# Patient Record
Sex: Male | Born: 1995 | Hispanic: Yes | Marital: Single | Smoking: Never smoker
Health system: Southern US, Community
[De-identification: ages and names within clinical notes are randomized; demographics above are authoritative.]

## PROBLEM LIST (undated history)

## (undated) HISTORY — PX: APPENDECTOMY: SHX54

---
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
## 2018-06-18 ENCOUNTER — Emergency Department (HOSPITAL_COMMUNITY)
Admission: EM | Admit: 2018-06-18 | Discharge: 2018-06-18 | Disposition: A | Discharge status: Home / Self Care | Payer: Self-pay | Attending: Emergency Medicine | Admitting: Emergency Medicine

## 2018-06-18 ENCOUNTER — Encounter (HOSPITAL_COMMUNITY): Payer: Self-pay | Admitting: *Deleted

## 2018-06-18 DIAGNOSIS — H66001 Acute suppurative otitis media without spontaneous rupture of ear drum, right ear: Secondary | ICD-10-CM | POA: Insufficient documentation

## 2018-06-18 DIAGNOSIS — R05 Cough: Secondary | ICD-10-CM | POA: Insufficient documentation

## 2018-06-18 DIAGNOSIS — J029 Acute pharyngitis, unspecified: Secondary | ICD-10-CM | POA: Insufficient documentation

## 2018-06-18 MED ORDER — AMOXICILLIN 500 MG PO CAPS: 500.0000 mg | ORAL_CAPSULE | Freq: Two times a day (BID) | ORAL | 0 refills | Status: AC

## 2018-06-18 NOTE — Discharge Instructions (Addendum)
Please read instructions below.  You can take tylenol or ibuprofen as needed for sore throat or fever.  Drink plenty of water.  Use saline nasal spray for congestion. Take the antibiotic as prescribed until completely gone. Follow up with your primary care provider as needed.  Return to the ER for severely worsening ear pain, sudden hearing loss, inability to swallow liquids, difficulty breathing, or new or worsening symptoms.

## 2018-06-18 NOTE — ED Provider Notes (Signed)
[facility]   CSN: [id number] Arrival date & time: 06/18/18  1416     History   Chief Complaint Chief Complaint  Patient presents with  . Otalgia    HPI Kelly Santiago is a 22 y.o. male without significant PMHx, presenting to the ED with 4 days of ear pain. Pt states his family has similar cold sx. His sx consist of dry cough and improving sore throat. He also began having left ear pain 4 days ago which has resolved, however his right ear began hurting today. No assoc fever. Has taken nyquil with relief of cold sx.  The history is provided by the patient.    History reviewed. No pertinent past medical history.  There are no active problems to display for this patient.   Past Surgical History:  Procedure Laterality Date  . APPENDECTOMY          Home Medications    Prior to Admission medications   Medication Sig Start Date End Date Taking? Authorizing Provider  amoxicillin (AMOXIL) 500 MG capsule Take 1 capsule (500 mg total) by mouth 2 (two) times daily for 7 days. 06/18/18 06/25/18  , [place] N, PA-C    Family History History reviewed. No pertinent family history.  Social History Social History   Tobacco Use  . Smoking status: Never Smoker  Substance Use Topics  . Alcohol use: No  . Drug use: No     Allergies   Patient has no known allergies.   Review of Systems Review of Systems  Constitutional: Negative for fever.  HENT: Positive for ear pain and sore throat. Negative for congestion, ear discharge and trouble swallowing.   Respiratory: Positive for cough. Negative for shortness of breath.      Physical Exam Updated Vital Signs BP (!) 115/47 (BP Location: Right Arm)   Pulse (!) 56   Temp 98.4 F (36.9 C) (Oral)   Resp (!) 22   SpO2 99%   Physical Exam Vitals signs and nursing note reviewed.  Constitutional:      Appearance: He is well-developed.  HENT:     Head:  Normocephalic and atraumatic.     Right Ear: Hearing and ear canal normal. A middle ear effusion (purulent) is present. No mastoid tenderness. Tympanic membrane is bulging.     Left Ear: Hearing, tympanic membrane, ear canal and external ear normal.     Mouth/Throat:     Pharynx: Posterior oropharyngeal erythema present. No oropharyngeal exudate.     Comments: Tolerating secretions, no trismus or uvula deviation. Eyes:     Conjunctiva/sclera: Conjunctivae normal.  Neck:     Musculoskeletal: Normal range of motion.  Cardiovascular:     Rate and Rhythm: Normal rate and regular rhythm.  Pulmonary:     Effort: Pulmonary effort is normal.     Breath sounds: Normal breath sounds.  Lymphadenopathy:     Cervical: No cervical adenopathy.  Neurological:     Mental Status: He is alert.  Psychiatric:        Mood and Affect: Mood normal.        Behavior: Behavior normal.      ED Treatments / Results  Labs (all labs ordered are listed, but only abnormal results are displayed) Labs Reviewed - No data to display  EKG None  Radiology No results found.  Procedures Procedures (including critical care time)  Medications Ordered in ED Medications - No data to display   Initial Impression / Assessment  and Plan / ED Course  I have reviewed the triage vital signs and the nursing notes.  Pertinent labs & imaging results that were available during my care of the patient were reviewed by me and considered in my medical decision making (see chart for details).    Patient presents with otalgia and exam consistent with acute otitis media. No concern for acute mastoiditis, meningitis. No antibiotic use in the last month.  Patient discharged home with Amoxicillin. Advised PCP follow up.  I have also discussed reasons to return immediately to the ER.  Pt expresses understanding and agrees with plan.  Discussed results, findings, treatment and follow up. Patient advised of return precautions.  Patient verbalized understanding and agreed with plan.  Final Clinical Impressions(s) / ED Diagnoses   Final diagnoses:  Non-recurrent acute suppurative otitis media of right ear without spontaneous rupture of tympanic membrane    ED Discharge Orders         Ordered    amoxicillin (AMOXIL) 500 MG capsule  2 times daily     06/18/18 1602           , [place] N, PA-C 06/18/18 1604    Gerhard MunchLockwood, Robert, MD 06/20/18 1526

## 2018-06-18 NOTE — ED Notes (Signed)
Pt stable and ambulatory for discharge, states understanding follow up.  

## 2018-06-18 NOTE — ED Triage Notes (Signed)
Pt in c/o earache for the last few days, no distress noted

## 2020-10-23 ENCOUNTER — Emergency Department (HOSPITAL_COMMUNITY): Payer: Self-pay

## 2020-10-23 ENCOUNTER — Encounter (HOSPITAL_COMMUNITY): Payer: Self-pay

## 2020-10-23 ENCOUNTER — Other Ambulatory Visit: Payer: Self-pay

## 2020-10-23 ENCOUNTER — Emergency Department (HOSPITAL_COMMUNITY)
Admission: EM | Admit: 2020-10-23 | Discharge: 2020-10-23 | Disposition: A | Discharge status: Home / Self Care | Payer: Self-pay | Attending: Emergency Medicine | Admitting: Emergency Medicine

## 2020-10-23 DIAGNOSIS — R1032 Left lower quadrant pain: Secondary | ICD-10-CM | POA: Insufficient documentation

## 2020-10-23 DIAGNOSIS — S161XXA Strain of muscle, fascia and tendon at neck level, initial encounter: Secondary | ICD-10-CM

## 2020-10-23 DIAGNOSIS — R059 Cough, unspecified: Secondary | ICD-10-CM | POA: Insufficient documentation

## 2020-10-23 DIAGNOSIS — R1012 Left upper quadrant pain: Secondary | ICD-10-CM | POA: Insufficient documentation

## 2020-10-23 DIAGNOSIS — R198 Other specified symptoms and signs involving the digestive system and abdomen: Secondary | ICD-10-CM

## 2020-10-23 DIAGNOSIS — M62838 Other muscle spasm: Secondary | ICD-10-CM

## 2020-10-23 DIAGNOSIS — M62830 Muscle spasm of back: Secondary | ICD-10-CM | POA: Insufficient documentation

## 2020-10-23 LAB — CBC WITH DIFFERENTIAL/PLATELET
Abs Immature Granulocytes: 0.02 10*3/uL (ref 0.00–0.07)
Basophils Absolute: 0 10*3/uL (ref 0.0–0.1)
Basophils Relative: 1 %
Eosinophils Absolute: 0.3 10*3/uL (ref 0.0–0.5)
Eosinophils Relative: 4 %
HCT: 46 % (ref 39.0–52.0)
Hemoglobin: 15.1 g/dL (ref 13.0–17.0)
Immature Granulocytes: 0 %
Lymphocytes Relative: 43 %
Lymphs Abs: 3.5 10*3/uL (ref 0.7–4.0)
MCH: 30 pg (ref 26.0–34.0)
MCHC: 32.8 g/dL (ref 30.0–36.0)
MCV: 91.5 fL (ref 80.0–100.0)
Monocytes Absolute: 0.6 10*3/uL (ref 0.1–1.0)
Monocytes Relative: 7 %
Neutro Abs: 3.7 10*3/uL (ref 1.7–7.7)
Neutrophils Relative %: 45 %
Platelets: 248 10*3/uL (ref 150–400)
RBC: 5.03 MIL/uL (ref 4.22–5.81)
RDW: 13.6 % (ref 11.5–15.5)
WBC: 8.2 10*3/uL (ref 4.0–10.5)
nRBC: 0 % (ref 0.0–0.2)

## 2020-10-23 LAB — COMPREHENSIVE METABOLIC PANEL
ALT: 16 U/L (ref 0–44)
AST: 17 U/L (ref 15–41)
Albumin: 4.3 g/dL (ref 3.5–5.0)
Alkaline Phosphatase: 71 U/L (ref 38–126)
Anion gap: 10 (ref 5–15)
BUN: 9 mg/dL (ref 6–20)
CO2: 25 mmol/L (ref 22–32)
Calcium: 9.3 mg/dL (ref 8.9–10.3)
Chloride: 104 mmol/L (ref 98–111)
Creatinine, Ser: 0.8 mg/dL (ref 0.61–1.24)
GFR, Estimated: >60 mL/min (ref >60)
Glucose, Bld: 104 mg/dL — ABNORMAL HIGH (ref 70–99)
Potassium: 3.6 mmol/L (ref 3.5–5.1)
Sodium: 139 mmol/L (ref 135–145)
Total Bilirubin: 0.5 mg/dL (ref 0.3–1.2)
Total Protein: 7.7 g/dL (ref 6.5–8.1)

## 2020-10-23 LAB — URINALYSIS, ROUTINE W REFLEX MICROSCOPIC
Bilirubin Urine: NEGATIVE
Glucose, UA: NEGATIVE mg/dL
Hgb urine dipstick: NEGATIVE
Ketones, ur: NEGATIVE mg/dL
Leukocytes,Ua: NEGATIVE
Nitrite: NEGATIVE
Protein, ur: NEGATIVE mg/dL
Specific Gravity, Urine: 1.023 (ref 1.005–1.030)
pH: 6 (ref 5.0–8.0)

## 2020-10-23 LAB — LIPASE, BLOOD: Lipase: 27 U/L (ref 11–51)

## 2020-10-23 MED ORDER — METHOCARBAMOL 500 MG PO TABS: 500.0000 mg | ORAL_TABLET | Freq: Two times a day (BID) | ORAL | 0 refills | Status: AC | PRN

## 2020-10-23 MED ORDER — IOHEXOL 300 MG/ML  SOLN
100.0000 mL | Freq: Once | INTRAMUSCULAR | Status: AC | PRN
  Administered 2020-10-23: 100 mL via INTRAVENOUS

## 2020-10-23 NOTE — Discharge Instructions (Signed)
You were seen in the emergency department today for your abdominal pain and your neck/back pain.  Your physical exam and vital signs are very reassuring, as was your blood work and CT scan.  The muscles in your neck and upper are in what is called spasm, meaning they are inappropriately tightened up.  This can be quite painful.  To help with your pain you may take Tylenol and / or NSAID medication (such as ibuprofen or naproxen) to help with your pain.  Additionally, you have been prescribed a muscle relaxer called Robaxin to help relieve some of the muscle spasm.  Please be advised that this medication may make you very sleepy, so you should not drive or operate heavy machinery while you are taking it.  You may also utilize topical pain relief such as Biofreeze, IcyHot, or topical lidocaine patches.  I also recommend that you apply heat to the area, such as a hot shower or heating pad, and follow heat application with massage of the muscles that are most tight.  I also recommend you adjust your seating arrangement and your job to be more supportive of your posture.  In regards to your abdominal pain, there is no concerning findings on your CT scan the emergency department today.  Suspect you have mild constipation causing you to have sensation that you need to have multiple bowel movements every day to empty your colon.  You may begin taking daily MiraLAX which you may pick up over-the-counter.  Please return to the emergency department if you develop any numbness/tingling/weakness in your arms or legs, any difficulty urinating, or urinary incontinence chest pain, shortness of breath, abdominal pain, nausea or vomiting that does not stop, or any other new severe symptoms.

## 2020-10-23 NOTE — ED Triage Notes (Signed)
Pt reports mid thoracic back pain, intermittent for the past few months as well as left sided flank pain that radiates to his groin. Denies urinary s/s or n/v. Pt ambulatory.

## 2020-10-23 NOTE — ED Provider Notes (Signed)
[facility] EMERGENCY DEPARTMENT Provider Note   CSN: [id number] Arrival date & time: 10/23/20  2979     History Chief Complaint  Patient presents with  . Back Pain  . Flank Pain   History provided by the patient without the assistance of an interpreter.  Patient speaks fluent [place].  Kelly Santiago is a 25 y.o. male who presents with concern for midthoracic back pain intermittently over the last few months, worsened with movement, particularly while playing soccer.  Additionally has had 2 months of intermittent progressively worsening left-sided abdominal pain that starts in his flank and upper abdomen and radiates down into his groin.  Denies any urinary frequency, urgency, dysuria,difficulty passing urine, or hematuria.  Difficulty denies any fevers or chills.  He does state that he had a mild cough 2 days ago that has resolved at this time.  Denies any sore throat, congestion.  Denies nausea, vomiting, diarrhea but does state that he often experiences tenesmus without ability to pass stool.  Abdominal pain is relieved with bowel movement.   Patient with history of appendectomy.  He does not carry medical diagnoses and is not on any medications every day.   HPI     History reviewed. No pertinent past medical history.  There are no problems to display for this patient.   Past Surgical History:  Procedure Laterality Date  . APPENDECTOMY         No family history on file.  Social History   Tobacco Use  . Smoking status: Never Smoker  Substance Use Topics  . Alcohol use: No  . Drug use: No    Home Medications Prior to Admission medications   Not on File    Allergies    Patient has no known allergies.  Review of Systems   Review of Systems  Constitutional: Negative for activity change, appetite change, chills, diaphoresis, fatigue, fever and unexpected weight change.  HENT: Negative.   Respiratory: Negative.   Cardiovascular:  Negative.   Gastrointestinal: Positive for abdominal pain. Negative for blood in stool, constipation, diarrhea, nausea, rectal pain and vomiting.       Tenesmus  Genitourinary: Negative.   Musculoskeletal: Positive for back pain.  Skin: Negative.   Allergic/Immunologic: Negative.   Neurological: Negative.   Hematological: Negative.     Physical Exam Updated Vital Signs BP 114/66 (BP Location: Right Arm)   Pulse (!) 57   Temp 98.3 F (36.8 C) (Oral)   Resp 17   Ht 5\' 1"  (1.549 m)   Wt 63.5 kg   SpO2 99%   BMI 26.45 kg/m   Physical Exam Vitals and nursing note reviewed.  Constitutional:      Appearance: He is not ill-appearing or toxic-appearing.  HENT:     Head: Normocephalic and atraumatic.     Nose: Nose normal.     Mouth/Throat:     Mouth: Mucous membranes are moist.     Pharynx: Oropharynx is clear. Uvula midline. No pharyngeal swelling, oropharyngeal exudate, posterior oropharyngeal erythema or uvula swelling.     Tonsils: No tonsillar exudate. 2+ on the right. 2+ on the left.  Eyes:     General: Lids are normal. Vision grossly intact.        Right eye: No discharge.        Left eye: No discharge.     Conjunctiva/sclera: Conjunctivae normal.     Pupils: Pupils are equal, round, and reactive to light.  Neck:     Trachea: Trachea  and phonation normal.  Cardiovascular:     Rate and Rhythm: Normal rate and regular rhythm.     Pulses: Normal pulses.     Heart sounds: Normal heart sounds. No murmur heard.   Pulmonary:     Effort: Pulmonary effort is normal. No tachypnea, bradypnea, accessory muscle usage, prolonged expiration or respiratory distress.     Breath sounds: Normal breath sounds. No wheezing or rales.  Chest:     Chest wall: No mass, lacerations, deformity, swelling, tenderness, crepitus or edema.  Abdominal:     General: Bowel sounds are normal. There is no distension.     Palpations: Abdomen is soft.     Tenderness: There is abdominal tenderness in  the left upper quadrant and left lower quadrant. There is no right CVA tenderness, left CVA tenderness, guarding or rebound.  Musculoskeletal:        General: No deformity.     Cervical back: Normal range of motion and neck supple. Spasms present. No edema, rigidity, tenderness, bony tenderness or crepitus. No pain with movement, spinous process tenderness or muscular tenderness.     Thoracic back: Spasms and tenderness present. No bony tenderness.     Lumbar back: Spasms present. No tenderness or bony tenderness. Negative right straight leg raise test and negative left straight leg raise test.     Right lower leg: No edema.     Left lower leg: No edema.  Lymphadenopathy:     Cervical: No cervical adenopathy.  Skin:    General: Skin is warm and dry.     Capillary Refill: Capillary refill takes less than 2 seconds.  Neurological:     General: No focal deficit present.     Mental Status: He is alert and oriented to person, place, and time. Mental status is at baseline.     Sensory: Sensation is intact.     Motor: Motor function is intact.  Psychiatric:        Mood and Affect: Mood normal.     ED Results / Procedures / Treatments   Labs (all labs ordered are listed, but only abnormal results are displayed) Labs Reviewed  COMPREHENSIVE METABOLIC PANEL - Abnormal; Notable for the following components:      Result Value   Glucose, Bld 104 (*)    All other components within normal limits  LIPASE, BLOOD  CBC WITH DIFFERENTIAL/PLATELET  URINALYSIS, ROUTINE W REFLEX MICROSCOPIC    EKG None  Radiology DG Chest 2 View  Result Date: 10/23/2020 CLINICAL DATA:  Chest pain. EXAM: CHEST - 2 VIEW COMPARISON:  Nov 26, 2008. FINDINGS: The heart size and mediastinal contours are within normal limits. Both lungs are clear. The visualized skeletal structures are unremarkable. IMPRESSION: No active cardiopulmonary disease. Electronically Signed   By: Lupita RaiderJames  Green Jr M.D.   On: 10/23/2020 10:21    CT Abdomen Pelvis W Contrast  Result Date: 10/23/2020 CLINICAL DATA:  Left upper and left lower quadrant abdominal pain for 3 months. EXAM: CT ABDOMEN AND PELVIS WITH CONTRAST TECHNIQUE: Multidetector CT imaging of the abdomen and pelvis was performed using the standard protocol following bolus administration of intravenous contrast. CONTRAST:  100mL OMNIPAQUE IOHEXOL 300 MG/ML  SOLN COMPARISON:  None. FINDINGS: Lower chest: No significant pulmonary nodules or acute consolidative airspace disease. Hepatobiliary: Normal liver size. No liver mass. Normal gallbladder with no radiopaque cholelithiasis. No biliary ductal dilatation. Pancreas: Normal, with no mass or duct dilation. Spleen: Normal size. No mass. Adrenals/Urinary Tract: Normal adrenals. No contour deforming  renal masses. Normal size and location of the kidneys bilaterally. No hydronephrosis. Normal bladder. Stomach/Bowel: Normal non-distended stomach. Normal caliber small bowel with no small bowel wall thickening. Appendectomy. Normal large bowel with no diverticulosis, large bowel wall thickening or pericolonic fat stranding. Vascular/Lymphatic: Normal caliber abdominal aorta. Patent portal, splenic, hepatic and renal veins. No pathologically enlarged lymph nodes in the abdomen or pelvis. Reproductive: Normal size prostate. Other: No pneumoperitoneum, ascites or focal fluid collection. Musculoskeletal: No aggressive appearing focal osseous lesions. IMPRESSION: No acute abnormality. No evidence of bowel obstruction or acute bowel inflammation. Electronically Signed   By: Delbert Phenix M.D.   On: 10/23/2020 12:52    Procedures Procedures   Medications Ordered in ED Medications  iohexol (OMNIPAQUE) 300 MG/ML solution 100 mL (100 mLs Intravenous Contrast Given 10/23/20 1149)    ED Course  I have reviewed the triage vital signs and the nursing notes.  Pertinent labs & imaging results that were available during my care of the patient were  reviewed by me and considered in my medical decision making (see chart for details).  Clinical Course as of 10/23/20 1314  Tue Oct 23, 2020  1309 Leukocytes,Ua: NEGATIVE [RS]    Clinical Course User Index [RS] Shenia Alan, Idelia Salm   MDM Rules/Calculators/A&P                          50 old male presents with concern for intermittent muscular pain in the mid and upper back for the last few months as well as intermittent left-sided abdominal pain without nausea, vomiting, or diarrhea.  No urinary symptoms.  Differential diagnosis for this patient's  Listed abdominal pain includes but is not limited to constipation, diverticulitis, splenic infarct or ischemia, mesenteric ischemia, appendicitis, gastritis, duodenitis, GERD, musculoskeletal injury, pneumonia, pleural effusion.   Vital signs are normal on intake.  Cardiopulmonary exam is normal, abdominal exam revealed mild left-sided tenderness to palpation in the upper and lower quadrants.  Musculoskeletal exam revealed spasm and tenderness palpation of the thoracic and cervical paraspinous musculature.  Full range of motion of the cervical spine.  CBC unremarkable, CMP unremarkable, lipase is normal, urine is unremarkable,  chest x-ray negative for acute cardiopulmonary disease.  CT scan negative for acute intra-abdominal or pelvic abnormality.  Suspect muscle spasm as source for patient's intermittent neck and upper back pain.  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  Will prescribe course of Robaxin at home and education regarding improved posture and stretching.  In regards to patient's abdominal symptoms, there is no appear to be any emergent issue in his abdomen at this time. Suspect mild constipation causing sensation of tenesmus and multiple small bowel movements daily.  Recommend MiraLAX.  Will provide contact information for [facility] where he may establish care and  follow-up.  Ayiden voiced understanding of his medical evaluation and treatment plan.  Each of his questions was answered to his expressed satisfaction.  Return precautions given.  Patient is well-appearing, stable, and appropriate for discharge at this time.  This chart was dictated using voice recognition software, Dragon. Despite the best efforts of this provider to proofread and correct errors, errors may still occur which can change documentation meaning.  Final Clinical Impression(s) / ED Diagnoses Final diagnoses:  None    Rx / DC Orders ED Discharge Orders    None       Paloma Grange R, PA-C  10/23/20 1316    Pricilla Loveless, MD 10/24/20 [phone]

## 2020-10-23 NOTE — ED Notes (Signed)
Patient transported to x-ray. ?

## 2020-10-23 NOTE — ED Notes (Signed)
Patient transported to CT 

## 2021-09-14 IMAGING — CR DG CHEST 2V
2 series · 2 of 2 positions shown · non-contrast
Comparison: November 26, 2008.

CLINICAL DATA: Chest pain.

EXAM:
CHEST - 2 VIEW

[chest pa]
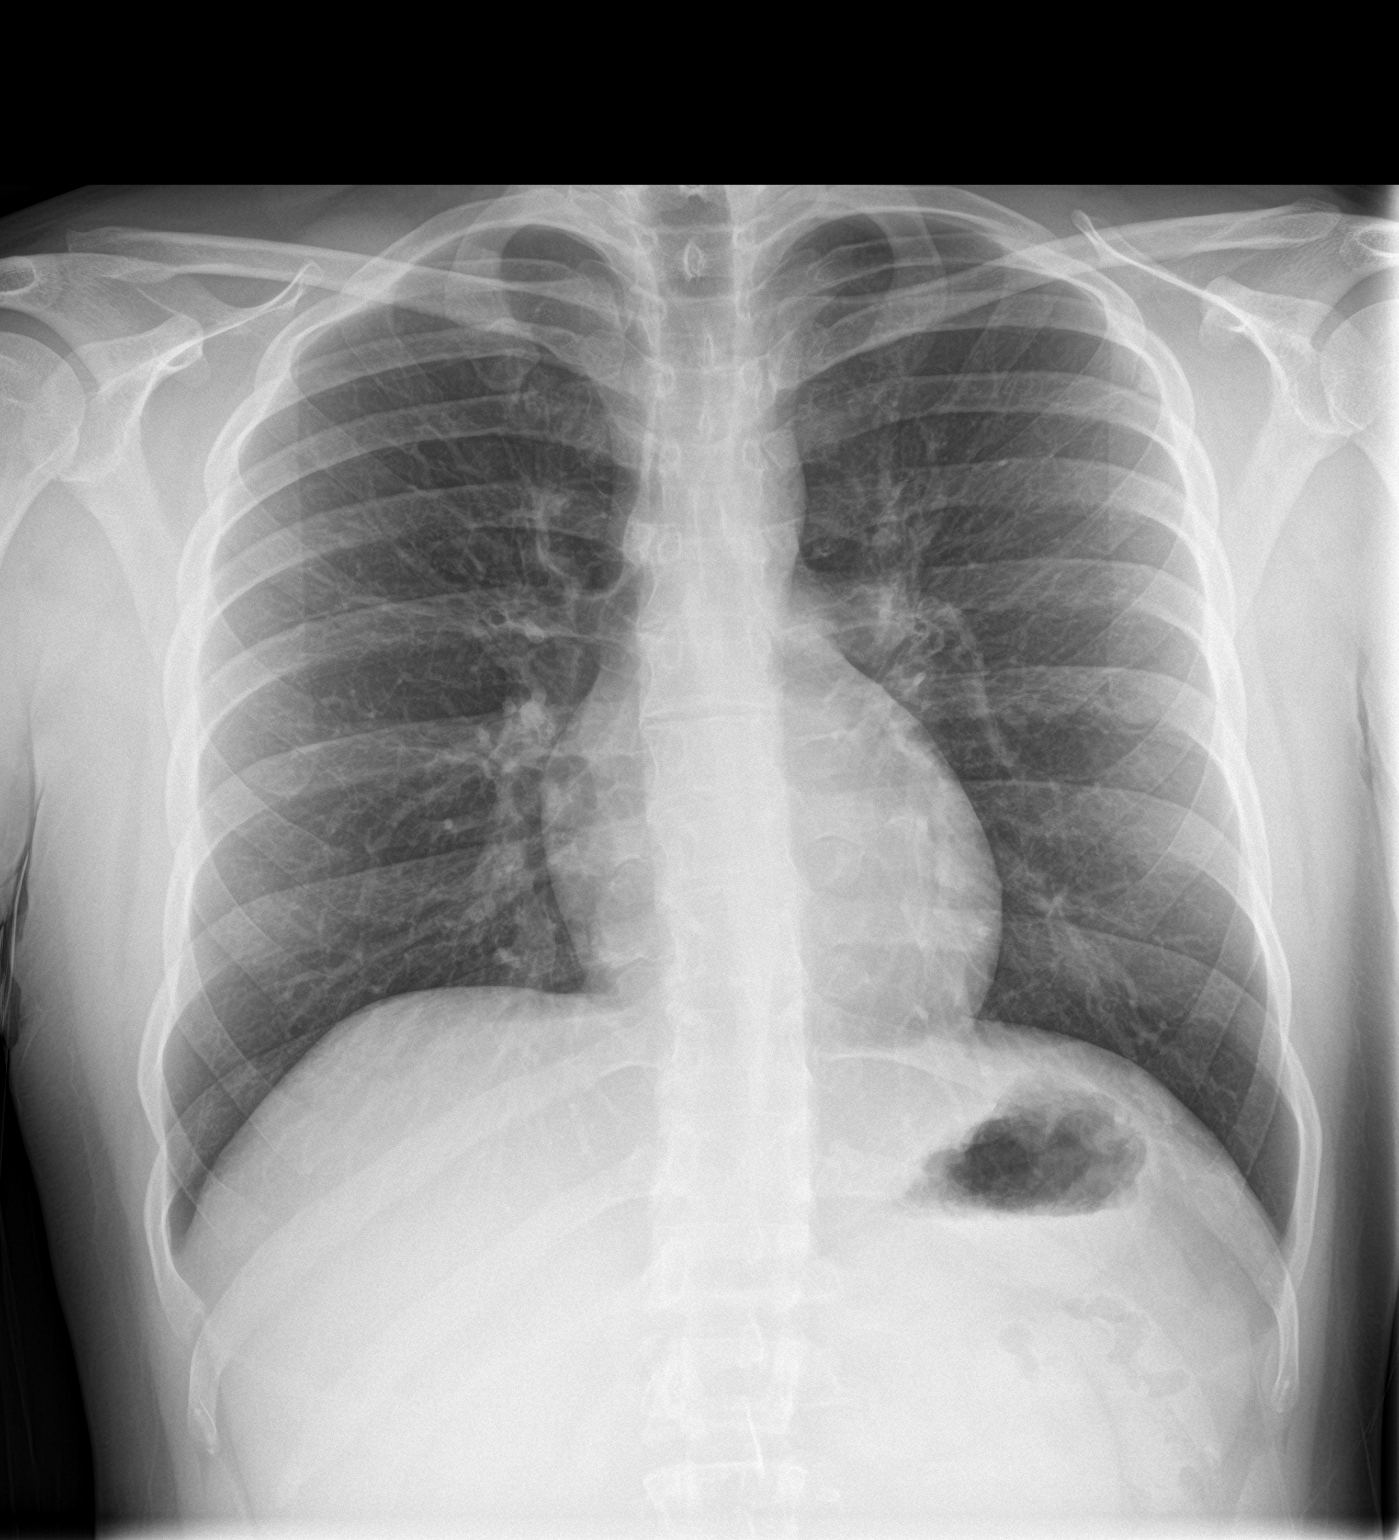

[chest lat]
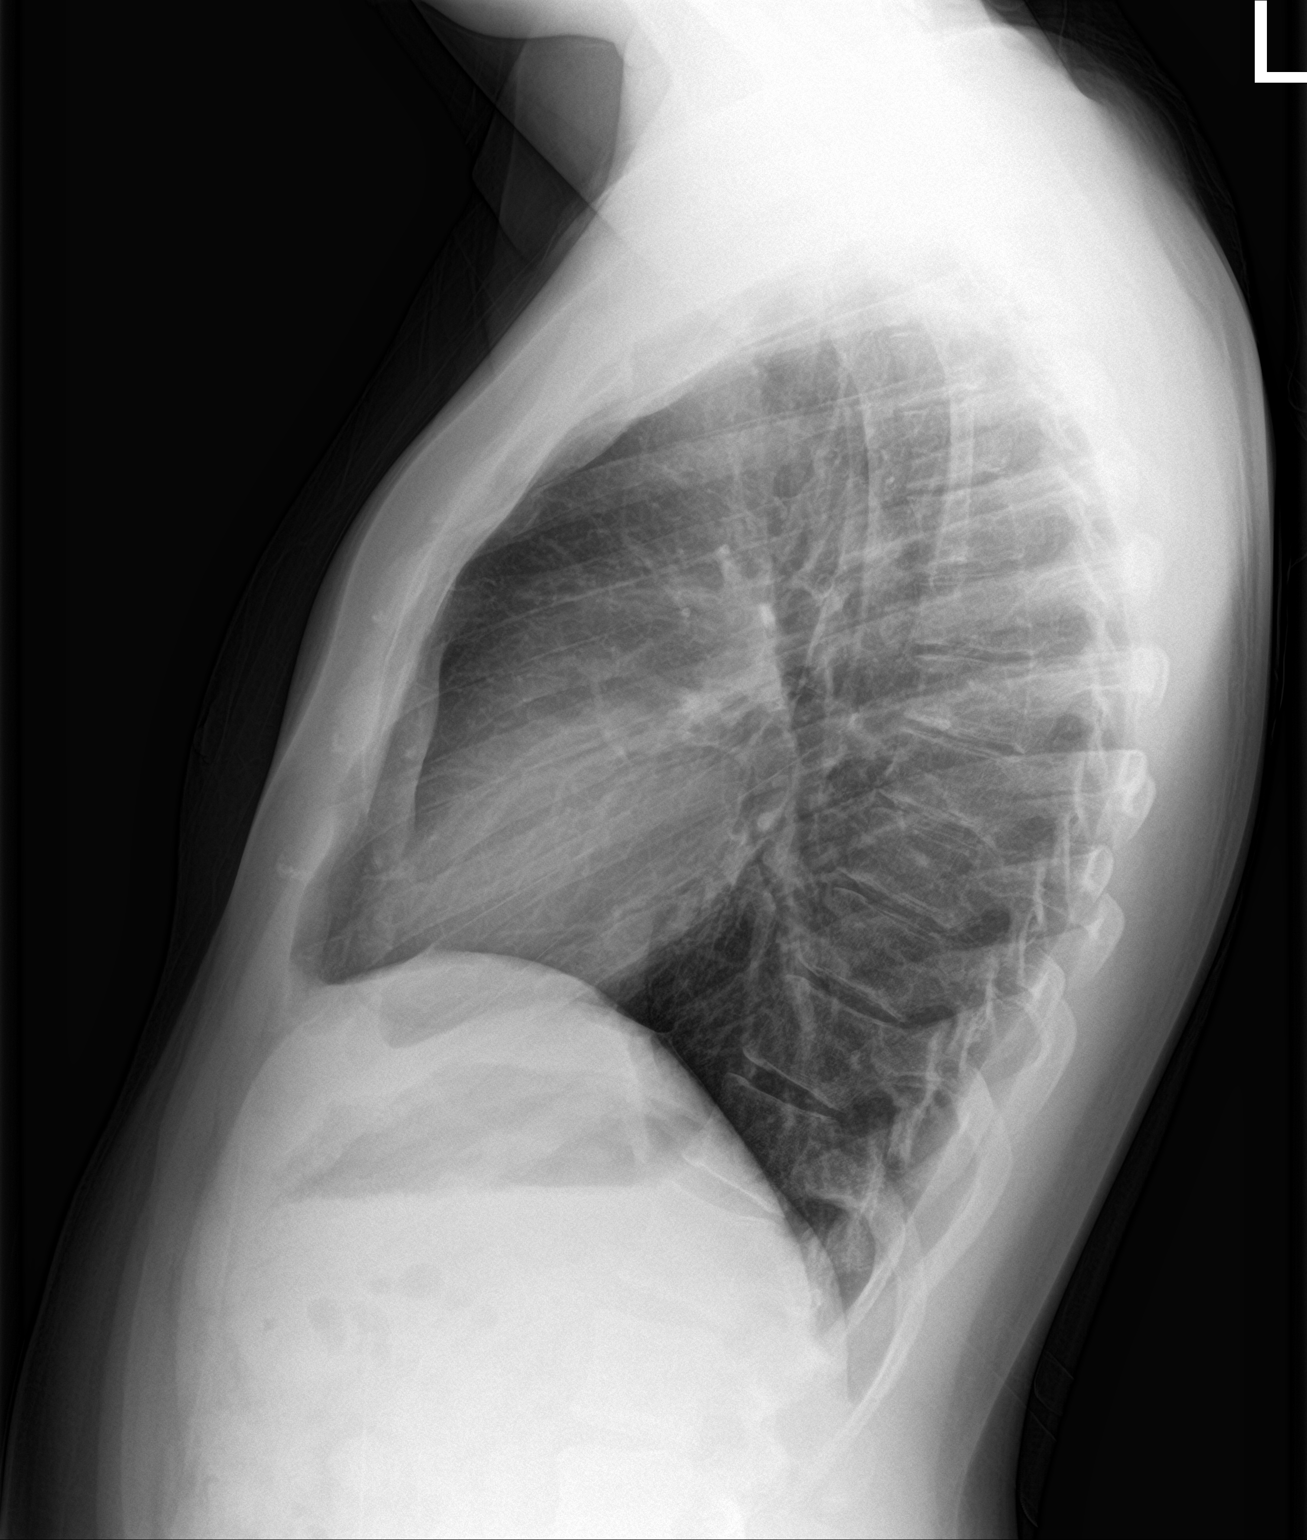

[2 of 2 positions shown; findings below may reference images not displayed]

FINDINGS: The heart size and mediastinal contours are within normal limits.
Both lungs are clear. The visualized skeletal structures are
unremarkable.
IMPRESSION: No active cardiopulmonary disease.

## 2021-12-12 ENCOUNTER — Other Ambulatory Visit: Payer: Self-pay

## 2021-12-12 ENCOUNTER — Encounter (HOSPITAL_COMMUNITY): Payer: Self-pay

## 2021-12-12 ENCOUNTER — Emergency Department (HOSPITAL_COMMUNITY)
Admission: EM | Admit: 2021-12-12 | Discharge: 2021-12-12 | Disposition: A | Discharge status: Home / Self Care | Payer: Medicaid Other | Attending: Emergency Medicine | Admitting: Emergency Medicine

## 2021-12-12 DIAGNOSIS — H66001 Acute suppurative otitis media without spontaneous rupture of ear drum, right ear: Secondary | ICD-10-CM | POA: Insufficient documentation

## 2021-12-12 DIAGNOSIS — R059 Cough, unspecified: Secondary | ICD-10-CM | POA: Insufficient documentation

## 2021-12-12 DIAGNOSIS — R0981 Nasal congestion: Secondary | ICD-10-CM | POA: Insufficient documentation

## 2021-12-12 MED ORDER — AMOXICILLIN 500 MG PO CAPS: 500.0000 mg | ORAL_CAPSULE | Freq: Three times a day (TID) | ORAL | 0 refills | Status: AC

## 2021-12-12 MED ORDER — AMOXICILLIN 500 MG PO CAPS: 500.0000 mg | ORAL_CAPSULE | Freq: Three times a day (TID) | ORAL | 0 refills | Status: DC

## 2021-12-12 NOTE — ED Provider Notes (Signed)
[facility] EMERGENCY DEPARTMENT Provider Note   CSN: [id number] Arrival date & time: 12/12/21  1207     History  Chief Complaint  Patient presents with   Otalgia    Kelly Santiago is a 26 y.o. male.  Patient reports he had a cough and congestion for the past week.  Patient reports he now has pain in his right ear he thinks he has an ear infection  The history is provided by the patient. No language interpreter was used.  Otalgia Location:  Right Behind ear:  No abnormality Quality:  Aching Severity:  Moderate Onset quality:  Gradual Duration:  1 week Timing:  Constant Chronicity:  New Relieved by:  Nothing      Home Medications Prior to Admission medications   Medication Sig Start Date End Date Taking? Authorizing Provider  amoxicillin (AMOXIL) 500 MG capsule Take 1 capsule (500 mg total) by mouth 3 (three) times daily. 12/12/21  Yes Cheron Schaumann K, PA-C  methocarbamol (ROBAXIN) 500 MG tablet Take 1 tablet (500 mg total) by mouth 2 (two) times daily as needed for muscle spasms. 10/23/20   Sponseller, Eugene Gavia, PA-C      Allergies    Patient has no known allergies.    Review of Systems   Review of Systems  HENT:  Positive for ear pain.   All other systems reviewed and are negative.   Physical Exam Updated Vital Signs BP 127/68 (BP Location: Right Arm)   Pulse (!) 57   Temp 97.8 F (36.6 C) (Oral)   Resp 14   SpO2 98%  Physical Exam Vitals and nursing note reviewed.  Constitutional:      General: He is not in acute distress.    Appearance: He is well-developed.  HENT:     Head: Normocephalic and atraumatic.     Ears:     Comments: Right TM is erythematous and bulging Eyes:     Conjunctiva/sclera: Conjunctivae normal.  Cardiovascular:     Rate and Rhythm: Normal rate and regular rhythm.     Heart sounds: No murmur heard. Pulmonary:     Effort: Pulmonary effort is normal. No respiratory distress.     Breath sounds: Normal  breath sounds.  Musculoskeletal:        General: No swelling.  Skin:    General: Skin is warm and dry.     Capillary Refill: Capillary refill takes less than 2 seconds.  Neurological:     Mental Status: He is alert.  Psychiatric:        Mood and Affect: Mood normal.     ED Results / Procedures / Treatments   Labs (all labs ordered are listed, but only abnormal results are displayed) Labs Reviewed - No data to display  EKG None  Radiology No results found.  Procedures Procedures    Medications Ordered in ED Medications - No data to display  ED Course/ Medical Decision Making/ A&P                           Medical Decision Making Risk Prescription drug management.   Patient has had a recent upper respiratory infection and now has ear pain exam is consistent with otitis media patient is given a prescription for amoxicillin he is advised to return if any problems        Final Clinical Impression(s) / ED Diagnoses Final diagnoses:  Non-recurrent acute suppurative otitis media of right ear without  spontaneous rupture of tympanic membrane    Rx / DC Orders ED Discharge Orders          Ordered    amoxicillin (AMOXIL) 500 MG capsule  3 times daily        12/12/21 1234           An After Visit Summary was printed and given to the patient.    Osie Cheeks 12/12/21 1236    Glynn Octave, MD 12/12/21 1627

## 2021-12-12 NOTE — Discharge Instructions (Addendum)
Return if any problems.

## 2021-12-12 NOTE — ED Triage Notes (Signed)
Patient complaining of right ear pain.

## 2021-12-13 ENCOUNTER — Telehealth: Payer: Self-pay | Admitting: *Deleted

## 2021-12-13 NOTE — Telephone Encounter (Signed)
Pt called regarding which pharmacy Rx was e-scribed to.  RNCM reviewed chart to access After Visit Summary and found that Rx was printed.  Pt states he did not receive it.  RNCM called in Rx to pharmacy of choice ([facility] on [address]).  Advised pt to pick up within the hour.

## 2022-07-31 ENCOUNTER — Telehealth: Payer: Self-pay

## 2022-07-31 NOTE — Telephone Encounter (Signed)
Sending mychart msg. AS, CMA 

## 2022-10-22 ENCOUNTER — Emergency Department (HOSPITAL_COMMUNITY)
Admission: EM | Admit: 2022-10-22 | Discharge: 2022-10-22 | Disposition: A | Discharge status: Home / Self Care | Payer: Medicaid Other | Attending: Emergency Medicine | Admitting: Emergency Medicine

## 2022-10-22 ENCOUNTER — Other Ambulatory Visit: Payer: Self-pay

## 2022-10-22 ENCOUNTER — Emergency Department (HOSPITAL_COMMUNITY): Payer: Medicaid Other

## 2022-10-22 DIAGNOSIS — N50811 Right testicular pain: Secondary | ICD-10-CM | POA: Diagnosis not present

## 2022-10-22 LAB — URINALYSIS, ROUTINE W REFLEX MICROSCOPIC
Bilirubin Urine: NEGATIVE
Glucose, UA: NEGATIVE mg/dL
Hgb urine dipstick: NEGATIVE
Ketones, ur: NEGATIVE mg/dL
Leukocytes,Ua: NEGATIVE
Nitrite: NEGATIVE
Protein, ur: NEGATIVE mg/dL
Specific Gravity, Urine: 1.017 (ref 1.005–1.030)
pH: 5 (ref 5.0–8.0)

## 2022-10-22 MED ORDER — ACETAMINOPHEN 500 MG PO TABS: 1000.0000 mg | ORAL_TABLET | Freq: Once | ORAL | Status: DC

## 2022-10-22 NOTE — ED Provider Notes (Signed)
[place] EMERGENCY DEPARTMENT AT [facility] Provider Note   CSN: [id number] Arrival date & time: 10/22/22  0935     History  Chief Complaint  Patient presents with   Back Pain   Testicle Pain    Kelly Santiago is a 27 y.o. male with no significant PMH who presents with back/testicular pain.   Patient presents w/ acute onset and offset severe R testicular pain that radiated into his stomach. It stopped after he laid down and rested for approx. 1 hour. Occurred again today about 5 hours ago, lasted again for an hour. Looked at his testicle during the pain and it seemed bigger than the left one, very tender to palpation. Also complains of suprapubic abdominal pain that has been constant, very mild, but constant. No h/o similar. Also endorses unprotected sex recently with one male partner, he is monogamous but would like STD testing. Hasn't had any painful urination, rectal pain, penile discharge.    Back Pain Testicle Pain      Home Medications Prior to Admission medications   Medication Sig Start Date End Date Taking? Authorizing Provider  amoxicillin (AMOXIL) 500 MG capsule Take 1 capsule (500 mg total) by mouth 3 (three) times daily. 12/12/21   Elson Areas, PA-C  methocarbamol (ROBAXIN) 500 MG tablet Take 1 tablet (500 mg total) by mouth 2 (two) times daily as needed for muscle spasms. 10/23/20   Sponseller, Eugene Gavia, PA-C      Allergies    Patient has no known allergies.    Review of Systems   Review of Systems  Genitourinary:  Positive for testicular pain.  Musculoskeletal:  Positive for back pain.   Review of systems Negative for f/c.  A 10 point review of systems was performed and is negative unless otherwise reported in HPI.  Physical Exam Updated Vital Signs BP 118/79   Pulse (!) 46   Temp 97.8 F (36.6 C)   Resp 17   Ht 5\' 1"  (1.549 m)   Wt 68 kg   SpO2 99%   BMI 28.34 kg/m  Physical Exam General: Normal appearing male, lying in  bed.  HEENT: Sclera anicteric, MMM, trachea midline.  Cardiology: RRR, no murmurs/rubs/gallops.  Resp: Normal respiratory rate and effort. CTAB, no wheezes, rhonchi, crackles.  Abd: Soft, non-tender, non-distended. No rebound tenderness or guarding.  GU: Normal appearing uncircumcised genitalia. Normal lie of testicles bilaterally. No masses palpable of scrotum/testicle, no TTP. MSK: No peripheral edema or signs of trauma.  Skin: warm, dry.  Neuro: A&Ox4, CNs II-XII grossly intact. MAEs. Sensation grossly intact.  Psych: Normal mood and affect.   ED Results / Procedures / Treatments   Labs (all labs ordered are listed, but only abnormal results are displayed) Labs Reviewed  URINALYSIS, ROUTINE W REFLEX MICROSCOPIC    EKG None  Radiology No results found.  Procedures Procedures    Medications Ordered in ED Medications - No data to display  ED Course/ Medical Decision Making/ A&P                          Medical Decision Making Amount and/or Complexity of Data Reviewed Labs: ordered. Decision-making details documented in ED Course. Radiology: ordered. Decision-making details documented in ED Course.  Risk OTC drugs.    This patient presents to the ED for concern of intermittent severe testicular pain, this involves an extensive number of treatment options, and is a complaint that carries with it a high  risk of complications and morbidity.   MDM:    Intermittent severe and self-resolving unilateral testicular pain very concerning for intermittent testicular torsion. Exam demonstrates normal testicle without TTP, no c/f active torsion. UA without any pyuria, no penile discharge, low c/f G/C epididymitis esp given intermittent pain though he does have constant mild abdominal pain. Patient requests testing, do not believe he requires prophylactic treatment but he can follow results on MyChart. UA and scrotum w/ doppler reassuring.  I discussed with patient the concern that  he may be intermittently torsed in his testicle as he clinically seems consistent with that.  Patient reports understanding.  I relayed that if his symptoms occur again he will need to return to the emergency department and he should follow-up with his PCP as an outpatient.  Given discharge instructions and return precautions.   Clinical Course as of 10/22/22 2024  Wed Oct 22, 2022  1459 Urinalysis, Routine w reflex microscopic -Urine, Clean Catch Clear - no leukocytes, neg nitrites [HN]  1636 US SCROTUM W/DOPPLER Normal ultrasound appearance of the testicles. No evidence of testicular mass, torsion, or inflammation.   [HN]    Clinical Course User Index [HN] Loetta Rough, MD    Labs: I Ordered, and personally interpreted labs.  The pertinent results include:  those listed above  Imaging Studies ordered: I ordered imaging studies including Scrotal US w/ doppler I independently visualized and interpreted imaging. I agree with the radiologist interpretation  Additional history obtained from chart review.    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 All questions answered to patient's satisfaction.    Co morbidities that complicate the patient evaluation No past medical history on file.   Medicines No orders of the defined types were placed in this encounter.   I have reviewed the patients home medicines and have made adjustments as needed  Problem List / ED Course: Problem List Items Addressed This Visit   None Visit Diagnoses     Testicular pain, right    -  Primary                   This note was created using dictation software, which may contain spelling or grammatical errors.    Loetta Rough, MD 11/04/22 1059

## 2022-10-22 NOTE — ED Triage Notes (Signed)
Pt. Stated. I had lower back pain for 2 days and then I decided to come in and I had rt. Testicle pain which goes up into my stomach.

## 2022-10-22 NOTE — Discharge Instructions (Addendum)
Thank you for coming to [facility] Emergency Department. You were seen for testicular pain. We did an exam, labs, and imaging, and these showed no acute findings.  Please follow up with a urologist. You can call them to make an appointment.  Do not hesitate to return to the ED or call 911 if you experience: -Severe testicular pain -Worsening symptoms -Pain with urination, blood in the urine -Lightheadedness, passing out -Fevers/chills -Anything else that concerns you

## 2023-11-02 ENCOUNTER — Encounter (HOSPITAL_COMMUNITY): Payer: Self-pay

## 2023-11-02 ENCOUNTER — Emergency Department (HOSPITAL_COMMUNITY)
Admission: EM | Admit: 2023-11-02 | Discharge: 2023-11-02 | Disposition: A | Discharge status: Home / Self Care | Attending: Emergency Medicine | Admitting: Emergency Medicine

## 2023-11-02 DIAGNOSIS — R0789 Other chest pain: Secondary | ICD-10-CM

## 2023-11-02 DIAGNOSIS — M940 Chondrocostal junction syndrome [Tietze]: Secondary | ICD-10-CM | POA: Insufficient documentation

## 2023-11-02 NOTE — ED Provider Notes (Signed)
 [place] EMERGENCY DEPARTMENT AT [facility] Provider Note   CSN: [id number] Arrival date & time: 11/02/23  1040     History  No chief complaint on file.   Fransico Forst is a 28 y.o. male.  28 year old male presents with his mom for concern of left shoulder blade pain as well as chest wall swelling and mild pain.  He states the shoulder blade pain has been going on now for about 2 to 3 weeks and the chest wall pain and swelling started over the past few days.  Denies any injury.  The history is provided by the patient. No language interpreter was used.       Home Medications Prior to Admission medications   Medication Sig Start Date End Date Taking? Authorizing Provider  amoxicillin  (AMOXIL ) 500 MG capsule Take 1 capsule (500 mg total) by mouth 3 (three) times daily. 12/12/21   Sofia, Leslie K, PA-C  methocarbamol  (ROBAXIN ) 500 MG tablet Take 1 tablet (500 mg total) by mouth 2 (two) times daily as needed for muscle spasms. 10/23/20   Sponseller, Rebekah R, PA-C      Allergies    Patient has no known allergies.    Review of Systems   Review of Systems  Constitutional:  Negative for chills and fever.  Respiratory:  Negative for shortness of breath.   Cardiovascular:  Positive for chest pain (msk pain).  All other systems reviewed and are negative.   Physical Exam Updated Vital Signs BP 118/76 (BP Location: Left Arm)   Pulse 72   Temp 98.3 F (36.8 C) (Oral)   Resp 16   SpO2 99%  Physical Exam Vitals and nursing note reviewed.  Constitutional:      General: He is not in acute distress.    Appearance: Normal appearance. He is not ill-appearing.  HENT:     Head: Normocephalic and atraumatic.     Nose: Nose normal.  Eyes:     Conjunctiva/sclera: Conjunctivae normal.  Cardiovascular:     Rate and Rhythm: Normal rate and regular rhythm.  Pulmonary:     Effort: Pulmonary effort is normal. No respiratory distress.  Chest:    Musculoskeletal:         General: No deformity. Normal range of motion.     Cervical back: Normal range of motion.  Skin:    Findings: No rash.  Neurological:     Mental Status: He is alert.     ED Results / Procedures / Treatments   Labs (all labs ordered are listed, but only abnormal results are displayed) Labs Reviewed - No data to display  EKG None  Radiology No results found.  Procedures Procedures    Medications Ordered in ED Medications - No data to display  ED Course/ Medical Decision Making/ A&P                                 Medical Decision Making  28 year old male presents today for concern of chest wall pain and minimal swelling.  Over the costochondral region.  States it initially started 2 to 3 days ago with shoulder blade pain and the chest wall pain started a couple days ago.  No associated shortness of breath or palpitations.  No lightheadedness.  No swelling noted over the left costochondral region.  Potentially costochondritis.  Will treat as such and establish him with a PCP.  He is in agreement.  Return precautions  discussed.  Discharged in stable condition.   Final Clinical Impression(s) / ED Diagnoses Final diagnoses:  Chest wall pain  Costochondritis    Rx / DC Orders ED Discharge Orders     None         Lucina Sabal, PA-C 11/02/23 1228    Deatra Face, MD 11/03/23 [phone]

## 2023-11-02 NOTE — Discharge Instructions (Signed)
 Take ibuprofen 600 mg 3 times a day for the next 5 to 7 days.  Follow-up with your primary care doctor or establish care with the clinic I have listed.  Return for any emergent symptoms.

## 2023-11-02 NOTE — ED Triage Notes (Signed)
 Pt arrived reporting left should blade pain, no injury. Has been there for months. Also concerned about bump on left side of chest. No other symptoms reported

## 2024-01-25 ENCOUNTER — Ambulatory Visit: Payer: Self-pay | Admitting: *Deleted

## 2024-01-25 NOTE — Telephone Encounter (Signed)
 FYI Only or Action Required?: Action required by provider: no PCP.  Patient was last seen in primary care on no PCP scheduled new patient appt 03/09/24.  Called Nurse Triage reporting Chest Pain.  Symptoms began several weeks ago.  Interventions attempted: Nothing.  Symptoms are: stable.  Triage Disposition: See Physician Within 24 Hours  Patient/caregiver understands and will follow disposition?: Yes           Copied from CRM #[id number]. Topic: Clinical - Red Word Triage >> Jan 25, 2024 12:06 PM Tiffany S wrote: Red Word that prompted transfer to Nurse Triage: Left side chest pains Reason for Disposition  [1] Chest pain lasts < 5 minutes AND [2] NO chest pain or cardiac symptoms (e.g., breathing difficulty, sweating) now  (Exception: Chest pains that last only a few seconds.)  Answer Assessment - Initial Assessment Questions Recommended to be seen with in 4 hours due to SOB with exertion. Denies chest pain now. New patient appt scheduled for 03/09/24      1. LOCATION: Where does it hurt?       Left side chest  2. RADIATION: Does the pain go anywhere else? (e.g., into neck, jaw, arms, back)     Left back and ribs  3. ONSET: When did the chest pain begin? (Minutes, hours or days)      Few seconds  4. PATTERN: Does the pain come and go, or has it been constant since it started?  Does it get worse with exertion?      Comes and goes random moments  5. DURATION: How long does it last (e.g., seconds, minutes, hours)     Few seconds 6. SEVERITY: How bad is the pain?  (e.g., Scale 1-10; mild, moderate, or severe)     Sharp stabbing pain  7. CARDIAC RISK FACTORS: Do you have any history of heart problems or risk factors for heart disease? (e.g., angina, prior heart attack; diabetes, high blood pressure, high cholesterol, smoker, or strong family history of heart disease)     Na  8. PULMONARY RISK FACTORS: Do you have any history of lung disease?  (e.g., blood  clots in lung, asthma, emphysema, birth control pills)     Na  9. CAUSE: What do you think is causing the chest pain?     Not sure  10. OTHER SYMPTOMS: Do you have any other symptoms? (e.g., dizziness, nausea, vomiting, sweating, fever, difficulty breathing, cough)       No chest pain , gasping for air after jogging , and level ground. Standing gets dizzy and balance off. Example tieing shoe sits back up get black vision.  Protocols used: Chest Pain-A-AH

## 2024-02-02 DIAGNOSIS — R0602 Shortness of breath: Secondary | ICD-10-CM | POA: Diagnosis not present

## 2024-02-02 DIAGNOSIS — R072 Precordial pain: Secondary | ICD-10-CM | POA: Diagnosis not present

## 2024-02-04 DIAGNOSIS — Z0000 Encounter for general adult medical examination without abnormal findings: Secondary | ICD-10-CM | POA: Diagnosis not present

## 2024-02-04 DIAGNOSIS — R0602 Shortness of breath: Secondary | ICD-10-CM | POA: Diagnosis not present

## 2024-03-09 ENCOUNTER — Ambulatory Visit: Admitting: Family Medicine

## 2024-05-04 DIAGNOSIS — F17210 Nicotine dependence, cigarettes, uncomplicated: Secondary | ICD-10-CM | POA: Diagnosis not present

## 2024-05-04 DIAGNOSIS — M25512 Pain in left shoulder: Secondary | ICD-10-CM | POA: Diagnosis not present

## 2024-05-04 DIAGNOSIS — M545 Low back pain, unspecified: Secondary | ICD-10-CM | POA: Diagnosis not present

## 2024-05-04 DIAGNOSIS — M25552 Pain in left hip: Secondary | ICD-10-CM | POA: Diagnosis not present
